# Patient Record
Sex: Male | Born: 1980 | Race: Black or African American | Hispanic: No | Marital: Single | Smoking: Current every day smoker
Health system: Southern US, Community
[De-identification: ages and names within clinical notes are randomized; demographics above are authoritative.]

## PROBLEM LIST (undated history)

## (undated) ENCOUNTER — Emergency Department: Discharge status: Absent without leave | Payer: Self-pay

## (undated) DIAGNOSIS — F259 Schizoaffective disorder, unspecified: Secondary | ICD-10-CM

---

## 2004-03-07 ENCOUNTER — Inpatient Hospital Stay: Payer: Self-pay | Admitting: Unknown Physician Specialty

## 2004-06-19 ENCOUNTER — Inpatient Hospital Stay: Payer: Self-pay | Admitting: Psychiatry

## 2009-11-26 ENCOUNTER — Emergency Department: Payer: Self-pay | Admitting: Internal Medicine

## 2011-09-14 IMAGING — CT CT HEAD WITHOUT CONTRAST
3 of 4 series · 17 of 30 positions shown, 19 images · non-contrast
Comparison: none

REASON FOR EXAM: confusion
COMMENTS:

[Series 2: without · axial · non-contrast · 0.42mm/px · z∈[-121,-21]mm · 6 of 30 slices shown (1 of 2)]
[im 5/30  brain]
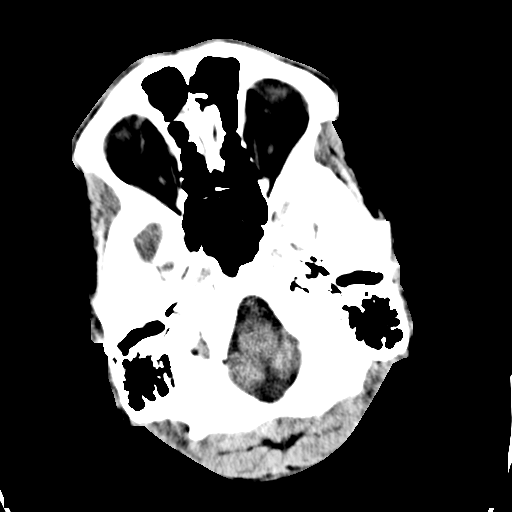
[im 9/30  brain]
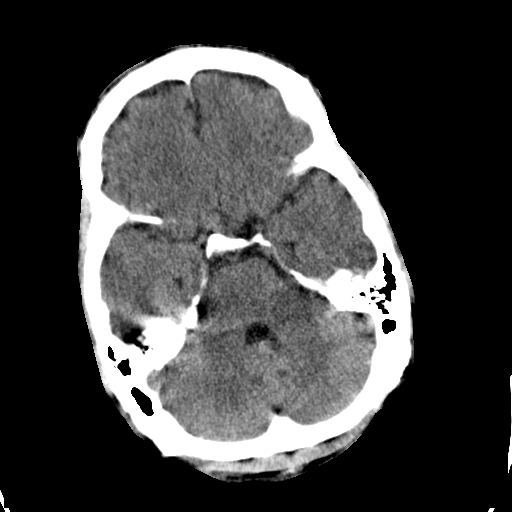
[im 13/30  brain]
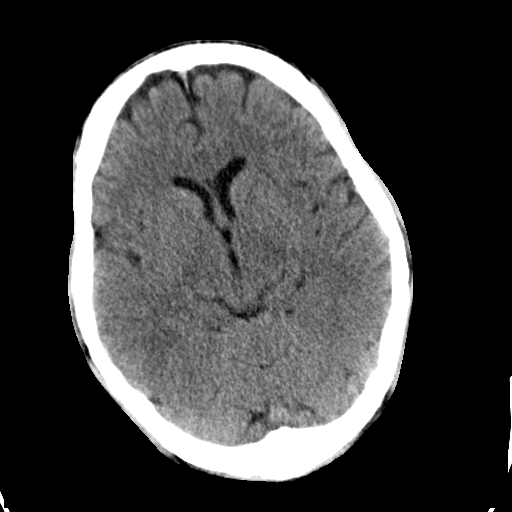
[im 17/30  brain]
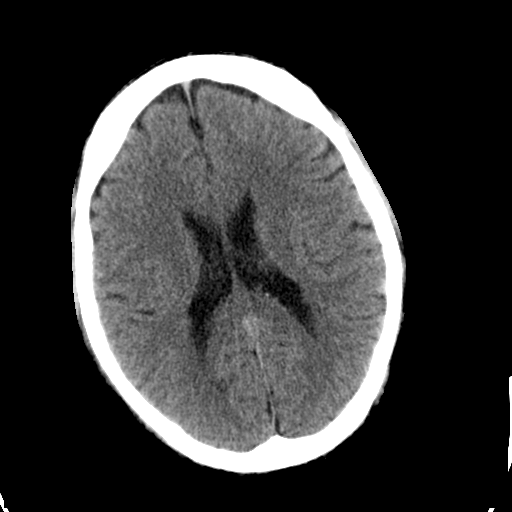
[im 21/30  brain]
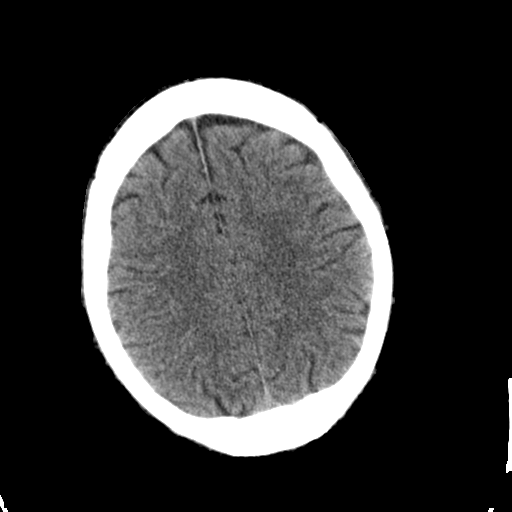
[im 25/30  brain]
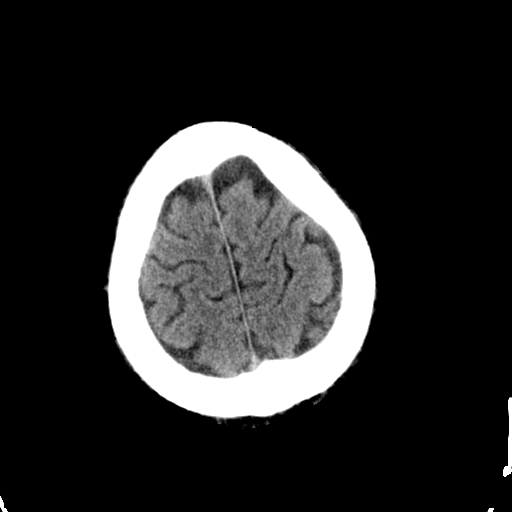

[Series 4: without · axial · non-contrast · 0.42mm/px · z∈[-125,-20]mm · 6 of 31 slices shown, 8 images (2 of 2)]
[im 5/31  brain]
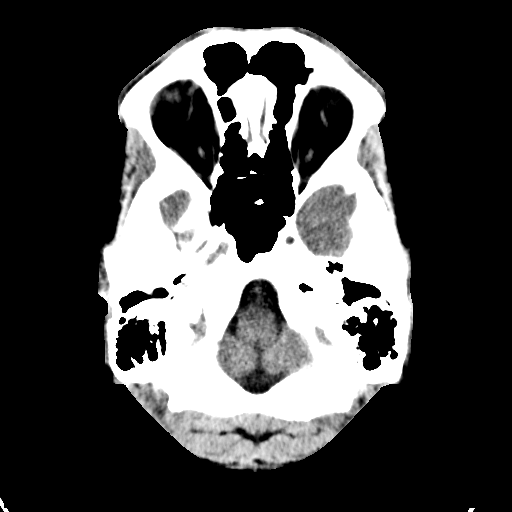
[im 5/31  bone]
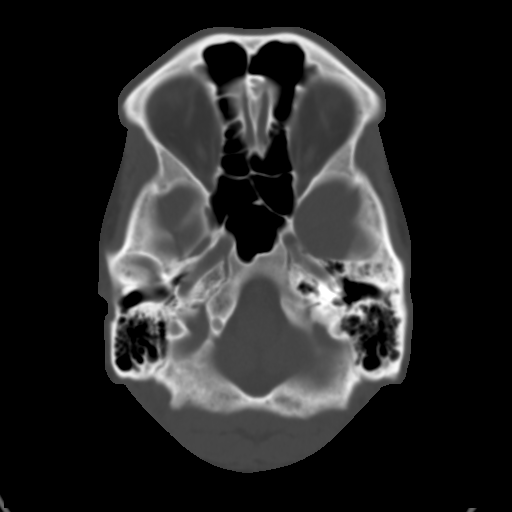
[im 9/31  brain]
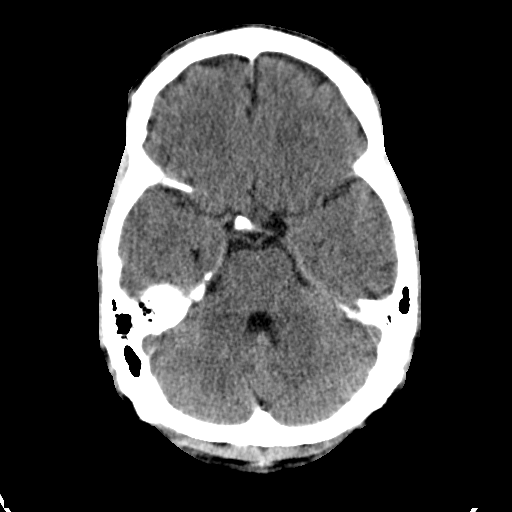
[im 13/31  brain]
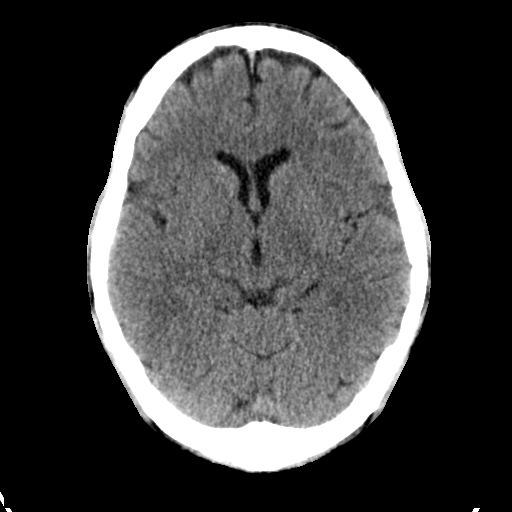
[im 18/31  brain]
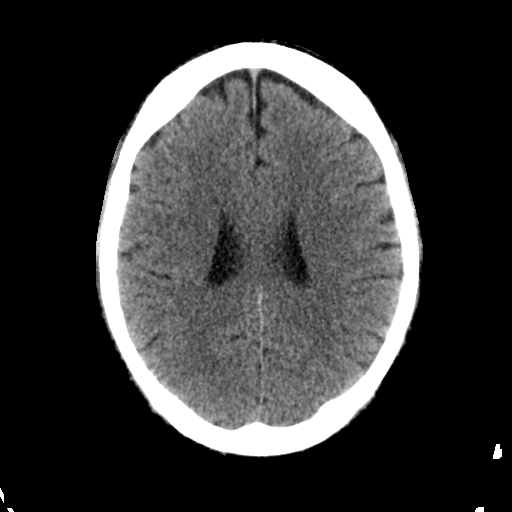
[im 22/31  brain]
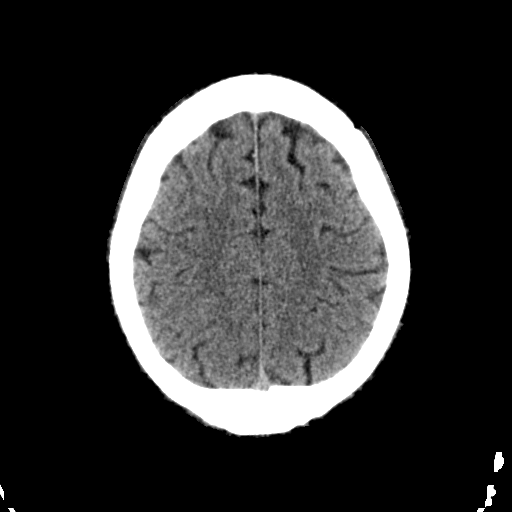
[im 22/31  bone]
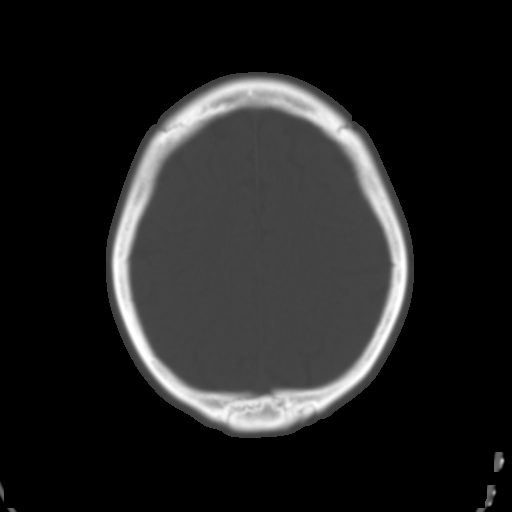
[im 26/31  brain]
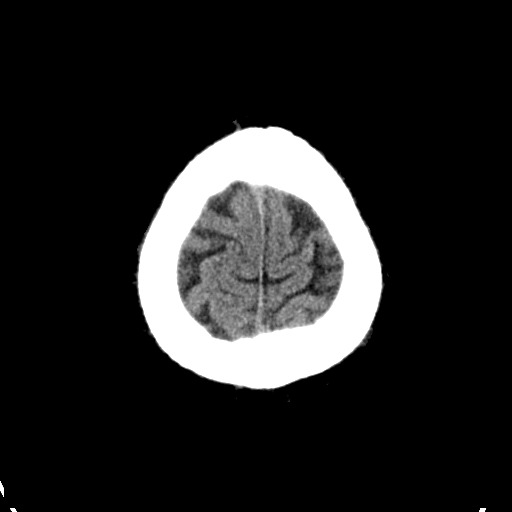

[Series 5: bone · axial · 0.42mm/px · z∈[-116,-16]mm · 5 of 30 slices shown]
[im 5/30  bone]
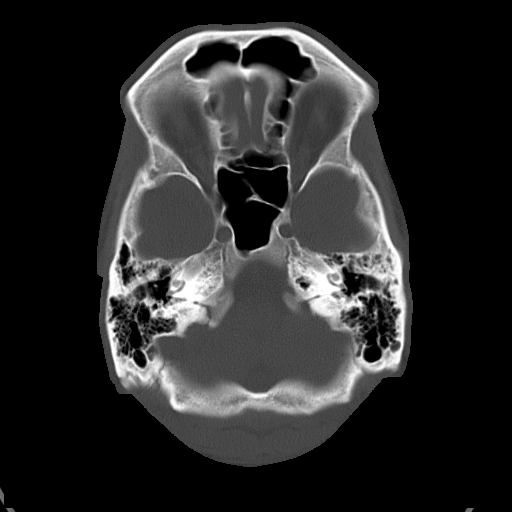
[im 10/30  bone]
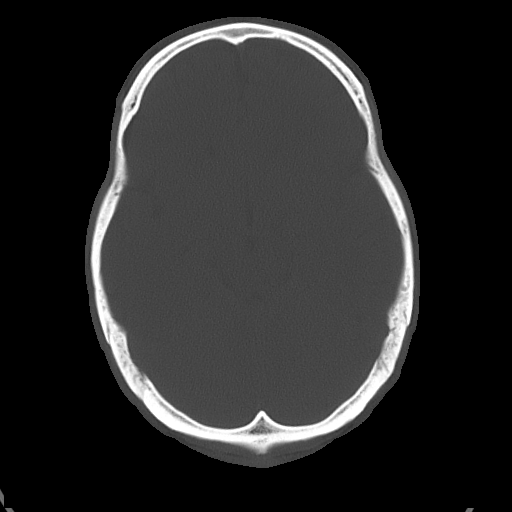
[im 15/30  bone]
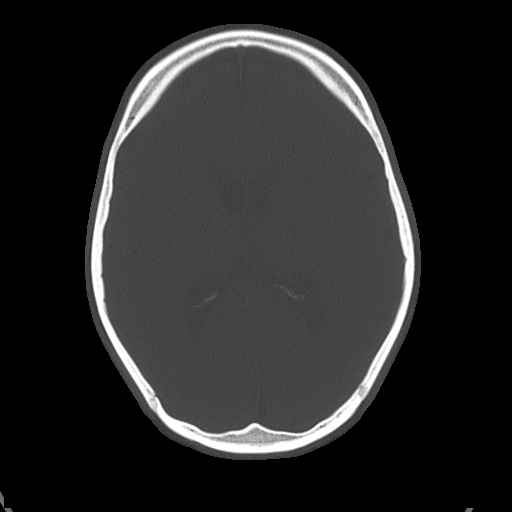
[im 20/30  bone]
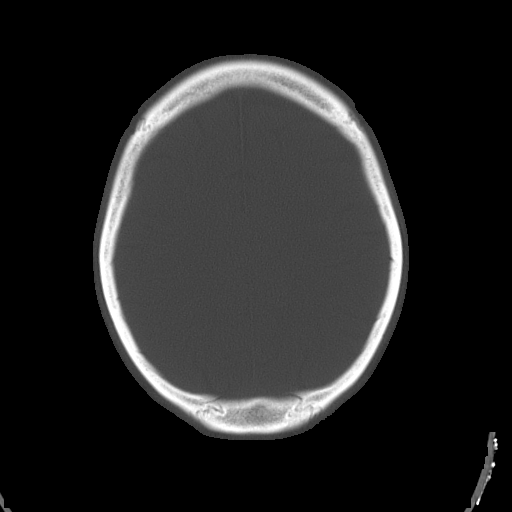
[im 25/30  bone]
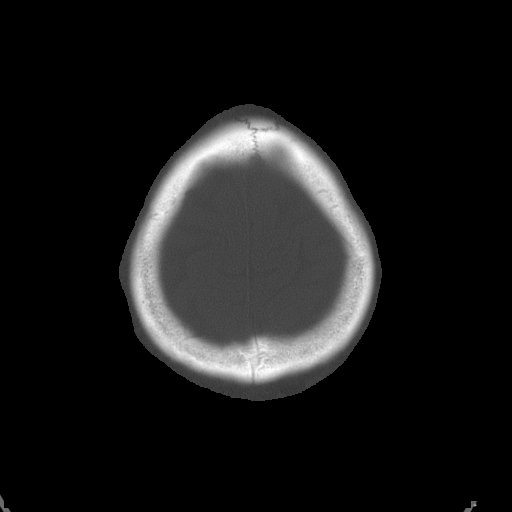

[17 of 30 positions shown; findings below may reference images not displayed]

PROCEDURE:     CT  - CT HEAD WITHOUT CONTRAST  - November 26, 2009 [DATE]

RESULT:     Axial noncontrast CT scanning was performed through the brain at
5 mm intervals and slice thicknesses.

The ventricles are normal in size and position. There is no intracranial
hemorrhage nor intracranial mass effect. The cerebellum and brainstem are
normal in density. At bone window settings I do not see evidence of an acute
skull fracture. The observed portions of the paranasal sinuses are clear.
IMPRESSION: I see no acute intracranial abnormality.

## 2018-08-29 ENCOUNTER — Other Ambulatory Visit: Payer: Self-pay | Admitting: Family Medicine

## 2018-08-29 DIAGNOSIS — Z20822 Contact with and (suspected) exposure to covid-19: Secondary | ICD-10-CM

## 2018-09-05 LAB — NOVEL CORONAVIRUS, NAA: SARS-CoV-2, NAA: NOT DETECTED

## 2018-09-07 ENCOUNTER — Telehealth: Payer: Self-pay | Admitting: *Deleted

## 2018-09-07 NOTE — Telephone Encounter (Signed)
Marietta Walker- nurse at [facility]- Calling for patient results.  Patient is with her and each patient identified his/herself with name and birthday and was given direct results. negative for COVID .  

## 2020-02-19 ENCOUNTER — Emergency Department
Admission: EM | Admit: 2020-02-19 | Discharge: 2020-02-19 | Discharge status: Absent without leave | Payer: Medicare Other

## 2022-12-24 ENCOUNTER — Other Ambulatory Visit: Payer: Self-pay | Admitting: Family Medicine

## 2022-12-24 DIAGNOSIS — R748 Abnormal levels of other serum enzymes: Secondary | ICD-10-CM

## 2024-02-27 ENCOUNTER — Emergency Department

## 2024-02-27 ENCOUNTER — Other Ambulatory Visit: Payer: Self-pay

## 2024-02-27 ENCOUNTER — Emergency Department
Admission: EM | Admit: 2024-02-27 | Discharge: 2024-02-27 | Disposition: A | Discharge status: Home / Self Care | Attending: Emergency Medicine | Admitting: Emergency Medicine

## 2024-02-27 ENCOUNTER — Encounter: Payer: Self-pay | Admitting: Radiology

## 2024-02-27 DIAGNOSIS — Y92410 Unspecified street and highway as the place of occurrence of the external cause: Secondary | ICD-10-CM | POA: Insufficient documentation

## 2024-02-27 DIAGNOSIS — S39012A Strain of muscle, fascia and tendon of lower back, initial encounter: Secondary | ICD-10-CM | POA: Insufficient documentation

## 2024-02-27 DIAGNOSIS — S3992XA Unspecified injury of lower back, initial encounter: Secondary | ICD-10-CM | POA: Diagnosis present

## 2024-02-27 HISTORY — DX: Schizoaffective disorder, unspecified: F25.9
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
## 2024-02-27 NOTE — Discharge Instructions (Signed)
 Follow-up with your primary care provider if any continued problems or concerns.  Ice or heat to your muscles as needed for discomfort.  May take Tylenol or ibuprofen as needed for muscle aches.
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
